# Patient Record
Sex: Male | Born: 1965 | Race: White | Hispanic: No | Marital: Married | Smoking: Current every day smoker
Health system: Southern US, Community
[De-identification: ages and names within clinical notes are randomized; demographics above are authoritative.]

## PROBLEM LIST (undated history)

## (undated) HISTORY — PX: KNEE SURGERY: SHX244

---

## 2005-01-15 ENCOUNTER — Ambulatory Visit (HOSPITAL_COMMUNITY): Admission: RE | Admit: 2005-01-15 | Discharge: 2005-01-15 | Payer: Self-pay | Admitting: Orthopaedic Surgery

## 2005-01-28 ENCOUNTER — Ambulatory Visit (HOSPITAL_COMMUNITY): Admission: RE | Admit: 2005-01-28 | Discharge: 2005-01-28 | Payer: Self-pay | Admitting: Orthopaedic Surgery

## 2013-06-01 ENCOUNTER — Emergency Department (INDEPENDENT_AMBULATORY_CARE_PROVIDER_SITE_OTHER): Payer: 59

## 2013-06-01 ENCOUNTER — Encounter (HOSPITAL_COMMUNITY): Payer: Self-pay | Admitting: Emergency Medicine

## 2013-06-01 ENCOUNTER — Emergency Department (INDEPENDENT_AMBULATORY_CARE_PROVIDER_SITE_OTHER)
Admission: EM | Admit: 2013-06-01 | Discharge: 2013-06-01 | Disposition: A | Discharge status: Home / Self Care | Payer: 59 | Source: Home / Self Care | Attending: Family Medicine | Admitting: Family Medicine

## 2013-06-01 DIAGNOSIS — IMO0002 Reserved for concepts with insufficient information to code with codable children: Secondary | ICD-10-CM

## 2013-06-01 DIAGNOSIS — M5410 Radiculopathy, site unspecified: Secondary | ICD-10-CM

## 2013-06-01 MED ORDER — GABAPENTIN 100 MG PO CAPS: 100.0000 mg | ORAL_CAPSULE | Freq: Three times a day (TID) | ORAL | Status: AC

## 2013-06-01 NOTE — ED Notes (Signed)
Reports having skin pain that starts at center of spine and radiates to center of abdomen to navel.   Symptoms present x 1 wk.  Pain is not relief with pain meds.     [facility]   No relief with pain meds prescribed.

## 2013-06-01 NOTE — Discharge Instructions (Signed)
Thank you for coming in today. Take gabapentin 3 times a day. Start just at night for a few days then increase to twice daily for a few days then 3 times daily.  [facility]bazabo [facility] or Dr. Farris HasKramer if Dr. Chong SicilianIbazabo is not available for a while.  I think your pain is coming from a pinched nerve. You may also try capsaicin cream.  Radicular Pain Radicular pain in either the arm or leg is usually from a bulging or herniated disk in the spine. A piece of the herniated disk may press against the nerves as the nerves exit the spine. This causes pain which is felt at the tips of the nerves down the arm or leg. Other causes of radicular pain may include:  Fractures.  Heart disease.  Cancer.  An abnormal and usually degenerative state of the nervous system or nerves (neuropathy). Diagnosis may require CT or MRI scanning to determine the primary cause.  Nerves that start at the neck (nerve roots) may cause radicular pain in the outer shoulder and arm. It can spread down to the thumb and fingers. The symptoms vary depending on which nerve root has been affected. In most cases radicular pain improves with conservative treatment. Neck problems may require physical therapy, a neck collar, or cervical traction. Treatment may take many weeks, and surgery may be considered if the symptoms do not improve.  Conservative treatment is also recommended for sciatica. Sciatica causes pain to radiate from the lower back or buttock area down the leg into the foot. Often there is a history of back problems. Most patients with sciatica are better after 2 to 4 weeks of rest and other supportive care. Short term bed rest can reduce the disk pressure considerably. Sitting, however, is not a good position since this increases the pressure on the disk. You should avoid bending, lifting, and all other activities which make the problem worse. Traction can be used in severe cases. Surgery is usually  reserved for patients who do not improve within the first months of treatment. Only take over-the-counter or prescription medicines for pain, discomfort, or fever as directed by your caregiver. Narcotics and muscle relaxants may help by relieving more severe pain and spasm and by providing mild sedation. Cold or massage can give significant relief. Spinal manipulation is not recommended. It can increase the degree of disc protrusion. Epidural steroid injections are often effective treatment for radicular pain. These injections deliver medicine to the spinal nerve in the space between the protective covering of the spinal cord and back bones (vertebrae). Your caregiver can give you more information about steroid injections. These injections are most effective when given within two weeks of the onset of pain.  You should see your caregiver for follow up care as recommended. A program for neck and back injury rehabilitation with stretching and strengthening exercises is an important part of management.  SEEK IMMEDIATE MEDICAL CARE IF:  You develop increased pain, weakness, or numbness in your arm or leg.  You develop difficulty with bladder or bowel control.  You develop abdominal pain. Document Released: 02/07/2004 Document Revised: 03/24/2011 Document Reviewed: 04/24/2008 [facility] Patient Information 2014 [employer].

## 2013-06-01 NOTE — ED Provider Notes (Signed)
Kenneth SalinaDaniel L Sigley is a 48 y.o. male who presents to Urgent Care today for painful skin. Patient notes painful sensitive skin on his right back radiating to the right abdomen. The skin is sensitive to touch. He denies any rash. The symptoms have been present for about a week. [facility] where a urinalysis and CT scan were negative. He was prescribed Norco which has not helped. He denies any fevers chills nausea vomiting or diarrhea. He feels well otherwise.   History reviewed. No pertinent past medical history. History  Substance Use Topics  . Smoking status: Current Every Day Smoker -- 0.50 packs/day    Types: Cigarettes  . Smokeless tobacco: Not on file  . Alcohol Use: Yes   ROS as above Medications: No current facility-administered medications for this encounter.   Current Outpatient Prescriptions  Medication Sig Dispense Refill  . gabapentin (NEURONTIN) 100 MG capsule Take 1 capsule (100 mg total) by mouth 3 (three) times daily.  90 capsule  0    Exam:  BP 132/83  Pulse 76  Temp(Src) 97.7 F (36.5 C) (Oral)  Resp 16  SpO2 99% Gen: Well NAD HEENT: EOMI,  MMM Lungs: Normal work of breathing. CTABL Heart: RRR no MRG Abd: NABS, Soft. NT, ND Exts: Brisk capillary refill, warm and well perfused.  Skin: Completely normal appearing. Skin is sensitive to touch right flank.  No results found for this or any previous visit (from the past 24 hour(s)). Dg Lumbar Spine Complete  06/01/2013   CLINICAL DATA:  Pain extending from the umbilicus to the right side around to the back for over 1 week. No injury.  EXAM: LUMBAR SPINE - COMPLETE 4+ VIEW  COMPARISON:  None.  FINDINGS: There are 5 non rib-bearing lumbar type vertebral bodies. No pars defects are identified. There is no listhesis. Vertebral body heights are preserved without evidence of compression fracture. Minimal anterior endplate spurring is present at multiple levels, most prominently superiorly at  L3 and L4. Intervertebral disc space heights appear relatively well preserved.  IMPRESSION: Minimal endplate spurring.  No acute abnormality identified.   Electronically Signed   By: Sebastian AcheAllen  Grady   On: 06/01/2013 14:48    Assessment and Plan: 48 y.o. male with hyperesthesia of the skin along the right flank. This is concerning for possible shingles versus radicular pain. Plan to treat with gabapentin. [facility] and management. Patient may benefit from MRI versus nerve conduction study. At this point I think shingles less likely given the fact that he has not developed a rash over a week.  Discussed warning signs or symptoms. Please see discharge instructions. Patient expresses understanding.    Rodolph BongEvan S Corey, MD 06/01/13 [phone]

## 2015-06-22 IMAGING — CR DG LUMBAR SPINE COMPLETE 4+V
5 series · 5 of 5 positions shown · non-contrast
Comparison: None.

CLINICAL DATA: Pain extending from the umbilicus to the right side
around to the back for over 1 week. No injury.

EXAM:
LUMBAR SPINE - COMPLETE 4+ VIEW

[view not recorded (1 of 5)]
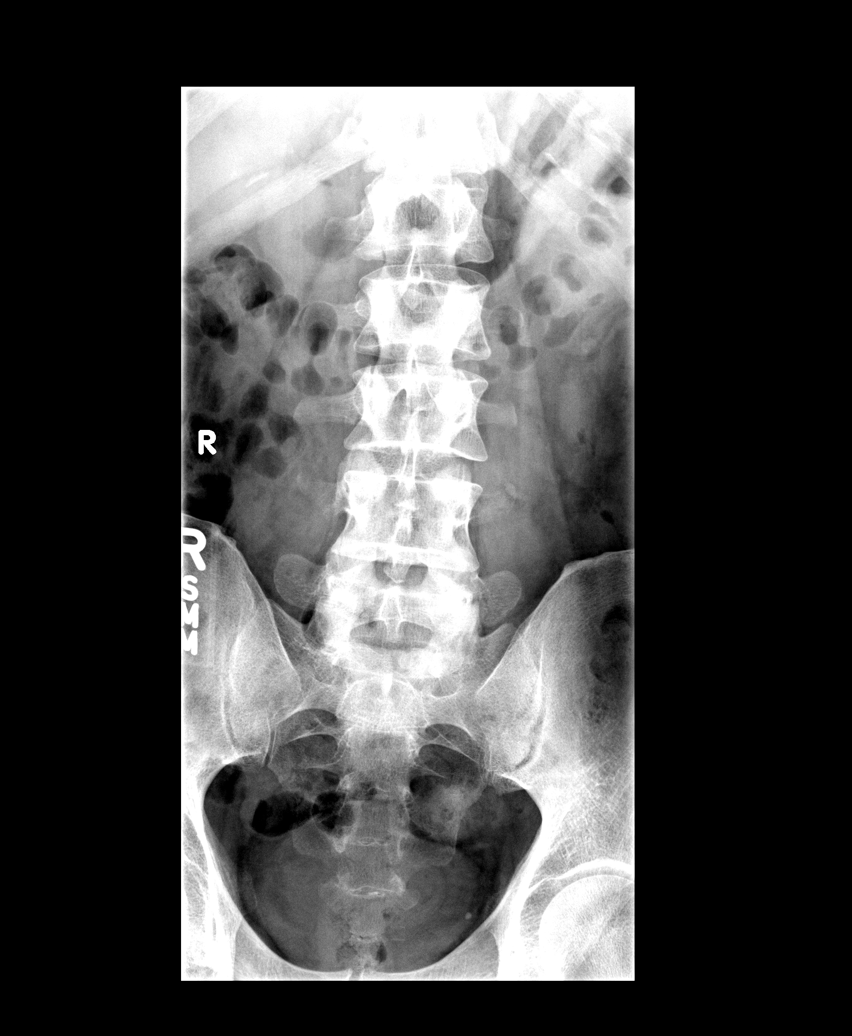

[view not recorded (2 of 5)]
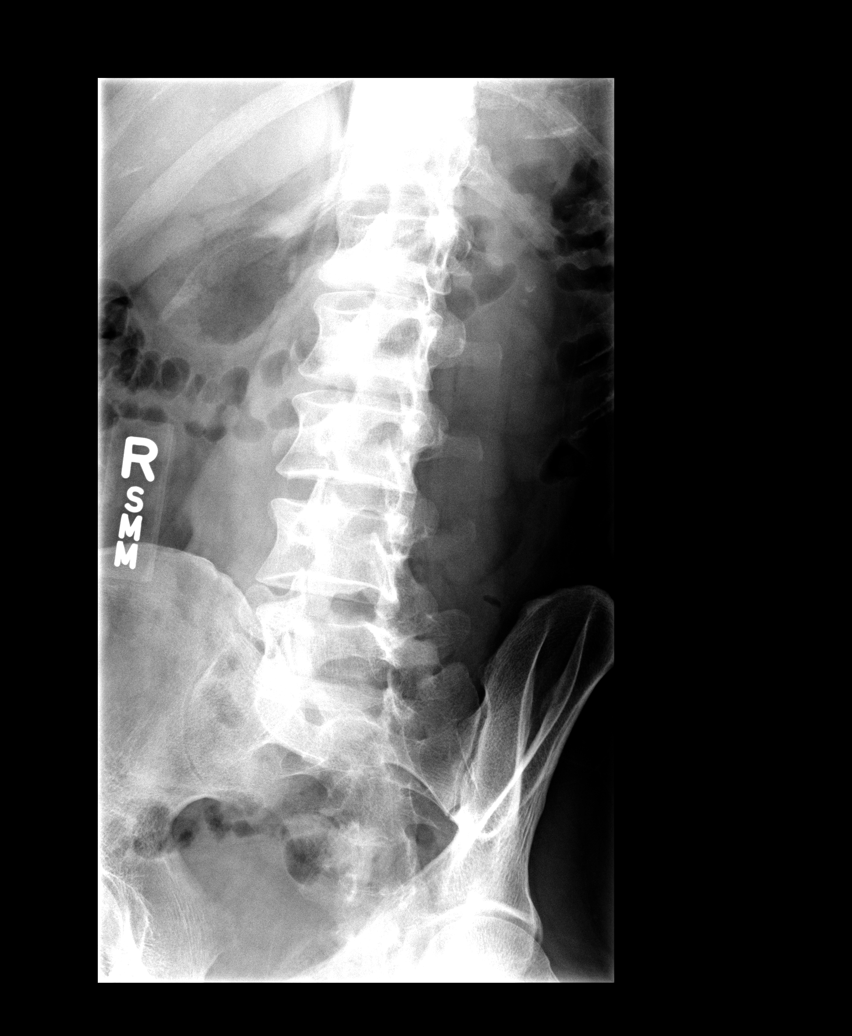

[view not recorded (3 of 5)]
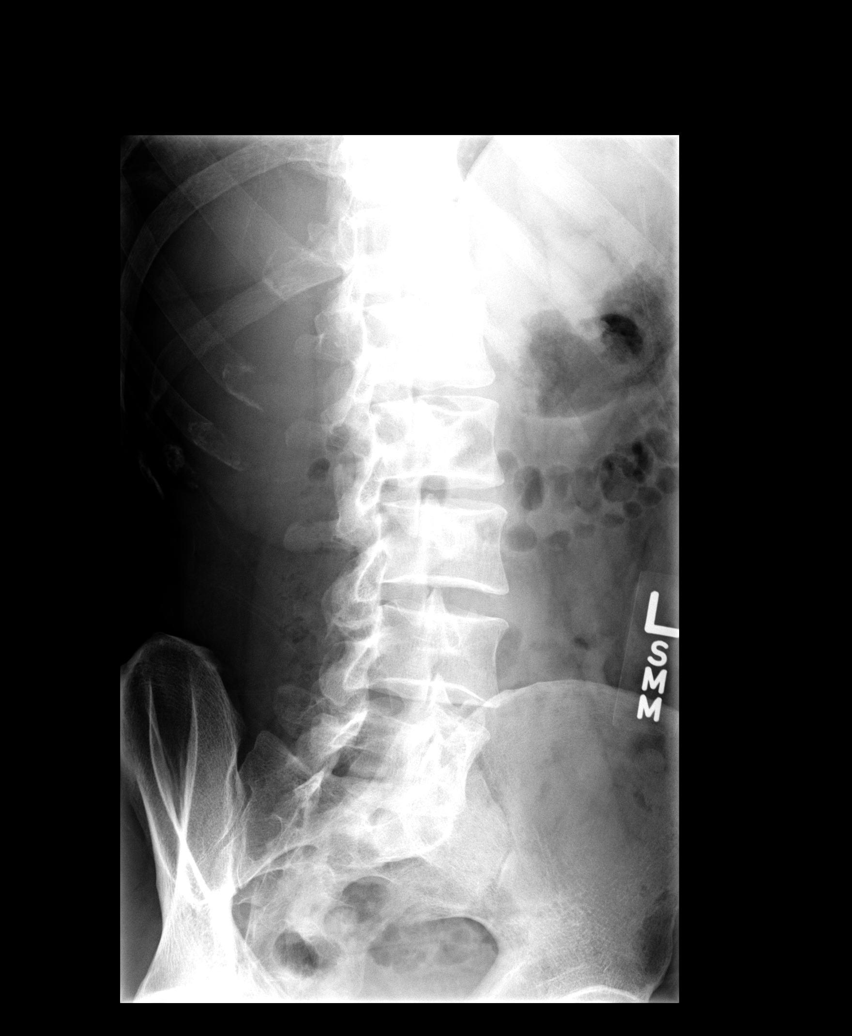

[view not recorded (4 of 5)]
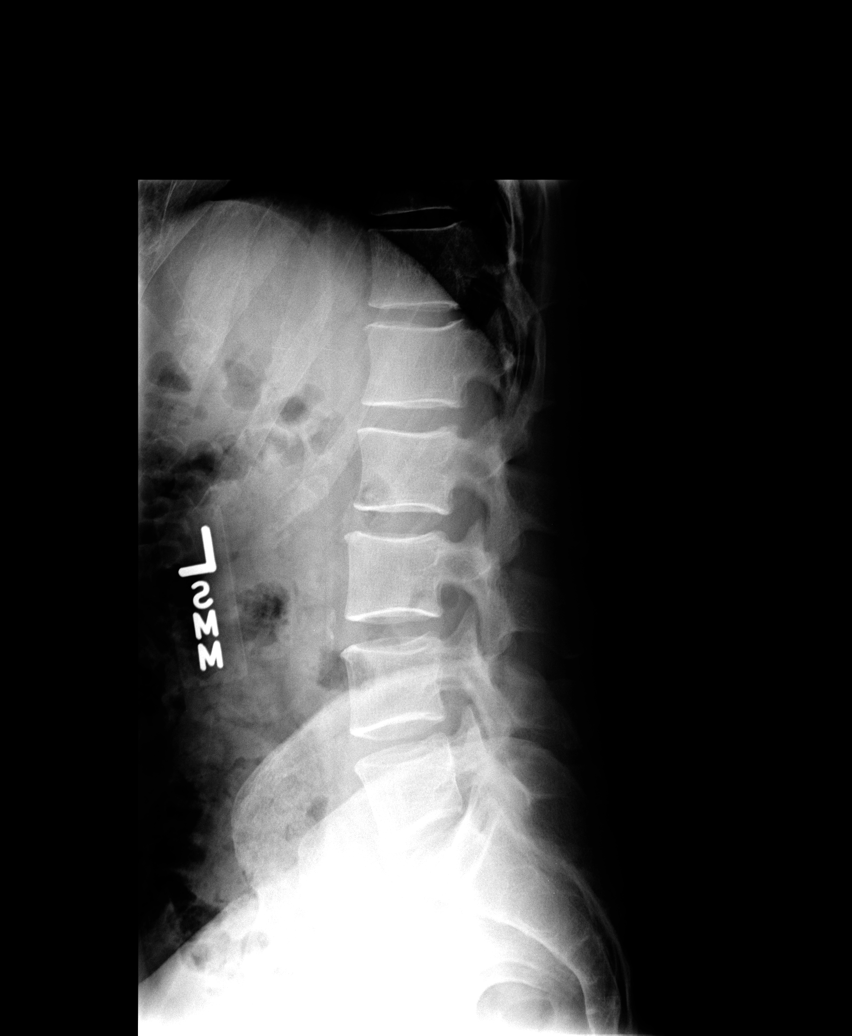

[view not recorded (5 of 5)]
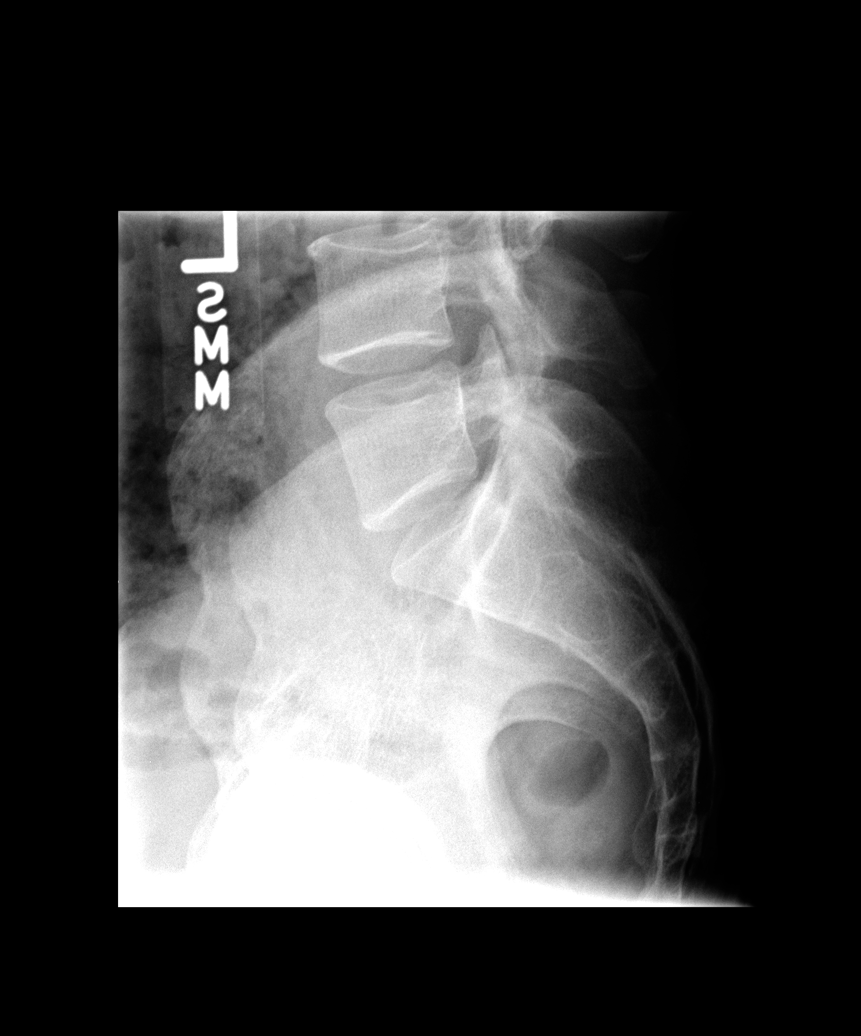

[5 of 5 positions shown; findings below may reference images not displayed]

FINDINGS: There are 5 non rib-bearing lumbar type vertebral bodies. No pars
defects are identified. There is no listhesis. Vertebral body
heights are preserved without evidence of compression fracture.
Minimal anterior endplate spurring is present at multiple levels,
most prominently superiorly at L3 and L4. Intervertebral disc space
heights appear relatively well preserved.
IMPRESSION: Minimal endplate spurring.  No acute abnormality identified.
